# Patient Record
Sex: Female | Born: 2000 | Race: Asian | Hispanic: No | Marital: Single | Smoking: Never smoker
Health system: Southern US, Community
[De-identification: ages and names within clinical notes are randomized; demographics above are authoritative.]

---
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
## 2019-05-28 ENCOUNTER — Other Ambulatory Visit: Payer: Self-pay

## 2019-05-28 ENCOUNTER — Emergency Department (HOSPITAL_COMMUNITY)
Admission: EM | Admit: 2019-05-28 | Discharge: 2019-05-28 | Disposition: A | Discharge status: Home / Self Care | Payer: BLUE CROSS/BLUE SHIELD | Attending: Emergency Medicine | Admitting: Emergency Medicine

## 2019-05-28 ENCOUNTER — Emergency Department (HOSPITAL_COMMUNITY): Payer: BLUE CROSS/BLUE SHIELD

## 2019-05-28 DIAGNOSIS — Y999 Unspecified external cause status: Secondary | ICD-10-CM | POA: Diagnosis not present

## 2019-05-28 DIAGNOSIS — S99911A Unspecified injury of right ankle, initial encounter: Secondary | ICD-10-CM | POA: Diagnosis present

## 2019-05-28 DIAGNOSIS — X501XXA Overexertion from prolonged static or awkward postures, initial encounter: Secondary | ICD-10-CM | POA: Diagnosis not present

## 2019-05-28 DIAGNOSIS — S93401A Sprain of unspecified ligament of right ankle, initial encounter: Secondary | ICD-10-CM | POA: Diagnosis not present

## 2019-05-28 DIAGNOSIS — Y939 Activity, unspecified: Secondary | ICD-10-CM | POA: Insufficient documentation

## 2019-05-28 DIAGNOSIS — Y929 Unspecified place or not applicable: Secondary | ICD-10-CM | POA: Insufficient documentation

## 2019-05-28 MED ORDER — ACETAMINOPHEN 325 MG PO TABS
650.0000 mg | ORAL_TABLET | Freq: Once | ORAL | Status: AC
  Administered 2019-05-28: 650 mg via ORAL
  Filled 2019-05-28: qty 2

## 2019-05-28 MED ORDER — IBUPROFEN 200 MG PO TABS
600.0000 mg | ORAL_TABLET | Freq: Once | ORAL | Status: AC
  Administered 2019-05-28: 600 mg via ORAL
  Filled 2019-05-28: qty 3

## 2019-05-28 NOTE — ED Provider Notes (Signed)
[facility] DEPT Provider Note   CSN: [id number] Arrival date & time: 05/28/19  0944     History Chief Complaint  Patient presents with  . Ankle Injury    right    Mary Blanchard is a 19 y.o. female who presents to ED with a chief complaint of right ankle pain and swelling.  1 week ago states that she twisted her ankle.  States that she has had several sprains on this ankle in the past but usually improve after several days.  She remains ambulatory.  She noticed this is swelling and pain did not improve despite using ice.  She denies any prior fracture, dislocations or procedures in the area.  Denies any warmth or redness of joint, changes in sensation or other injuries.  She denies possibility of pregnancy.  HPI     No past medical history on file.  There are no problems to display for this patient.   No past surgical history on file.   OB History   No obstetric history on file.     No family history on file.  Social History   Tobacco Use  . Smoking status: Never Smoker  . Smokeless tobacco: Never Used  Substance Use Topics  . Alcohol use: Never  . Drug use: Never    Home Medications Prior to Admission medications   Not on File    Allergies    Patient has no known allergies.  Review of Systems   Review of Systems  Constitutional: Negative for chills and fever.  Musculoskeletal: Positive for arthralgias. Negative for myalgias.  Skin: Negative for wound.  Neurological: Negative for weakness and numbness.    Physical Exam Updated Vital Signs BP 126/70 (BP Location: Left Arm)   Pulse 66   Temp 98.9 F (37.2 C) (Oral)   Resp 16   Ht 5' (1.524 m)   Wt 45.4 kg   LMP 05/18/2019   SpO2 100%   BMI 19.53 kg/m   Physical Exam Vitals and nursing note reviewed.  Constitutional:      General: She is not in acute distress.    Appearance: She is well-developed. She is not diaphoretic.  HENT:     Head: Normocephalic and  atraumatic.  Eyes:     General: No scleral icterus.    Conjunctiva/sclera: Conjunctivae normal.  Pulmonary:     Effort: Pulmonary effort is normal. No respiratory distress.  Musculoskeletal:        General: Swelling and tenderness present.     Cervical back: Normal range of motion.     Comments: Tenderness to palpation and slight swelling of the right lateral ankle.  Able to perform range of motion although reports pain with inversion.  2+ DP pulses palpated bilaterally.  Normal sensation to light touch.  Compartments are soft.  No wounds noted.  No warmth or erythema noted.  Skin:    Findings: No rash.  Neurological:     Mental Status: She is alert.     ED Results / Procedures / Treatments   Labs (all labs ordered are listed, but only abnormal results are displayed) Labs Reviewed - No data to display  EKG None  Radiology DG Ankle Complete Right  Result Date: 05/28/2019 CLINICAL DATA:  Soccer injury 1 week ago with persistent pain, initial encounter EXAM: RIGHT ANKLE - COMPLETE 3+ VIEW COMPARISON:  08/20/2017 FINDINGS: There is no evidence of fracture, dislocation, or joint effusion. There is no evidence of arthropathy or other focal  bone abnormality. Soft tissues are unremarkable. IMPRESSION: No acute abnormality noted. Electronically Signed   By: Alcide Clever M.D.   On: 05/28/2019 10:46    Procedures Procedures (including critical care time)  Medications Ordered in ED Medications  ibuprofen (ADVIL) tablet 600 mg (has no administration in time range)  acetaminophen (TYLENOL) tablet 650 mg (has no administration in time range)    ED Course  I have reviewed the triage vital signs and the nursing notes.  Pertinent labs & imaging results that were available during my care of the patient were reviewed by me and considered in my medical decision making (see chart for details).    MDM Rules/Calculators/A&P                      19 year old female presenting to the ED with  right ankle injury 1 week ago.  She has had several sprains in the same ankle in the past but the specific one has been taking longer to improve despite using ice.  She denies any prior fracture, dislocations or procedures in the area.  There is tenderness and swelling of the right lateral ankle without changes to range of motion.  Areas are vastly intact and compartments are soft.  Good distal pulses.  X-rays negative for acute abnormality.  Suspect that symptoms are again due to a sprain.  Feel that she will benefit from compression, rice therapy and orthopedic follow-up.  Patient advised to use NSAIDs as needed.  All imaging, if done today, including plain films, CT scans, and ultrasounds, independently reviewed by me, and interpretations confirmed via formal radiology reads.  Patient is hemodynamically stable, in NAD, and able to ambulate in the ED. Evaluation does not show pathology that would require ongoing emergent intervention or inpatient treatment. I explained the diagnosis to the patient. Pain has been managed and has no complaints prior to discharge. Patient is comfortable with above plan and is stable for discharge at this time. All questions were answered prior to disposition. Strict return precautions for returning to the ED were discussed. Encouraged follow up with PCP.   An After Visit Summary was printed and given to the patient.   Portions of this note were generated with Scientist, clinical (histocompatibility and immunogenetics). Dictation errors may occur despite best attempts at proofreading.  Final Clinical Impression(s) / ED Diagnoses Final diagnoses:  Sprain of right ankle, unspecified ligament, initial encounter    Rx / DC Orders ED Discharge Orders    None       Dietrich Pates, PA-C 05/28/19 1105    Terrilee Files, MD 05/28/19 [phone]

## 2019-05-28 NOTE — Progress Notes (Signed)
Orthopedic Tech Progress Note Patient Details:  [facility]ou [facility] 10-29-2000 [id number]  Ortho Devices Type of Ortho Device: ASO Ortho Device/Splint Location: RLE Ortho Device/Splint Interventions: Ordered, Application, Adjustment   Post Interventions Patient Tolerated: Well Instructions Provided: Poper ambulation with device, Care of device, Adjustment of device   Cree N Simpson 05/28/2019, 11:32 AM

## 2019-05-28 NOTE — Discharge Instructions (Addendum)
Follow instructions regarding your ankle sprain. You can take Tylenol and ibuprofen to help with pain and swelling. Wear the brace for compression. Follow-up with orthopedist listed below. Return to the ER if you start to experience additional injuries, worsening pain, swelling, redness or warmth of your joint.

## 2019-05-28 NOTE — ED Triage Notes (Signed)
Patient states she was playing soccer a week ago and injured her right ankle. Patient states she heard a cracking sound. Bruising noted to right ankle. Pain rated 3/10

## 2021-08-28 IMAGING — CR DG ANKLE COMPLETE 3+V*R*
3 series · 3 of 3 positions shown · non-contrast
Comparison: 08/20/2017

CLINICAL DATA: Soccer injury 1 week ago with persistent pain,
initial encounter

EXAM:
RIGHT ANKLE - COMPLETE 3+ VIEW

[x ankle ap right]
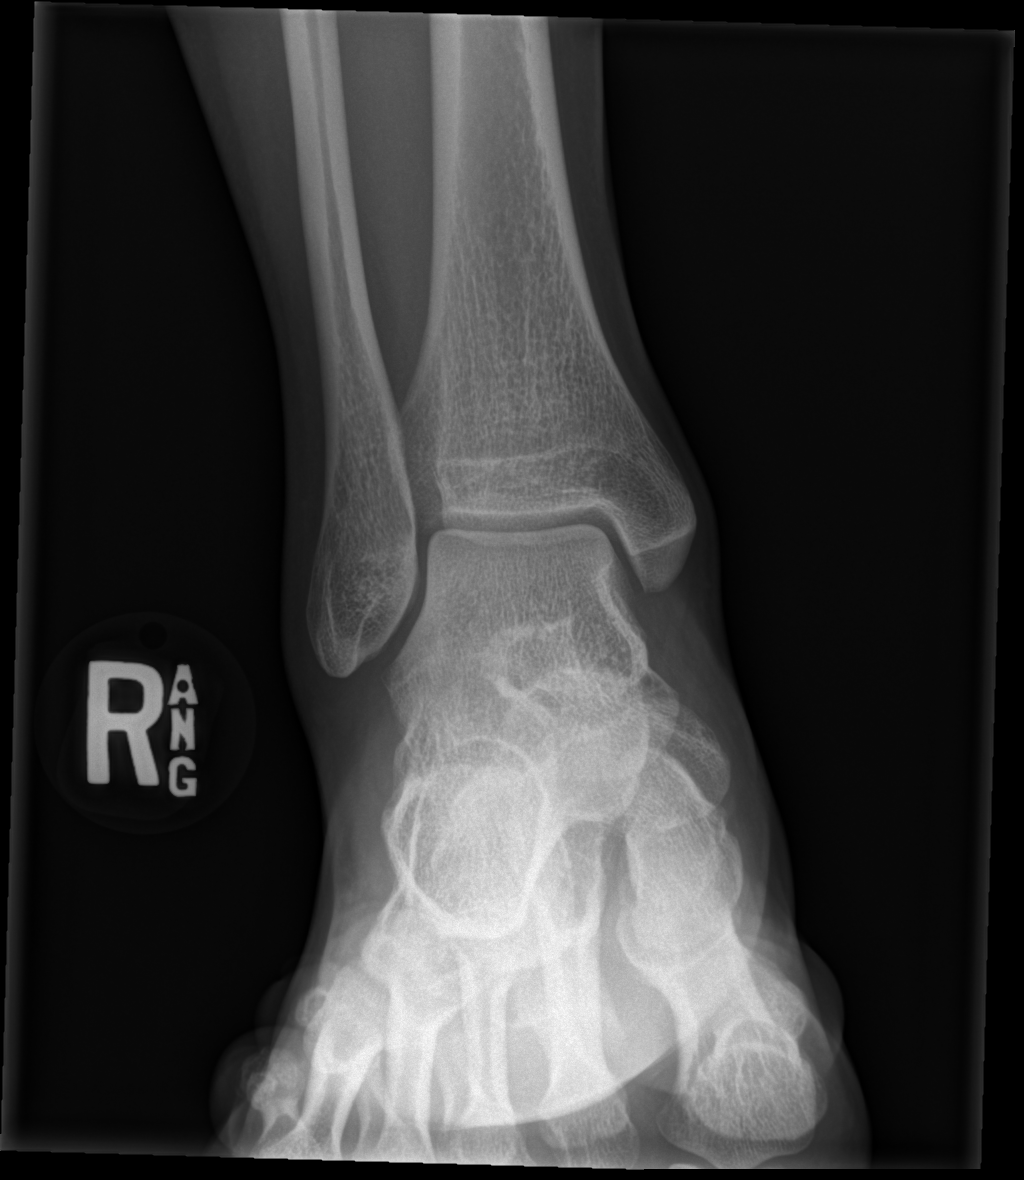

[x ankle obl right]
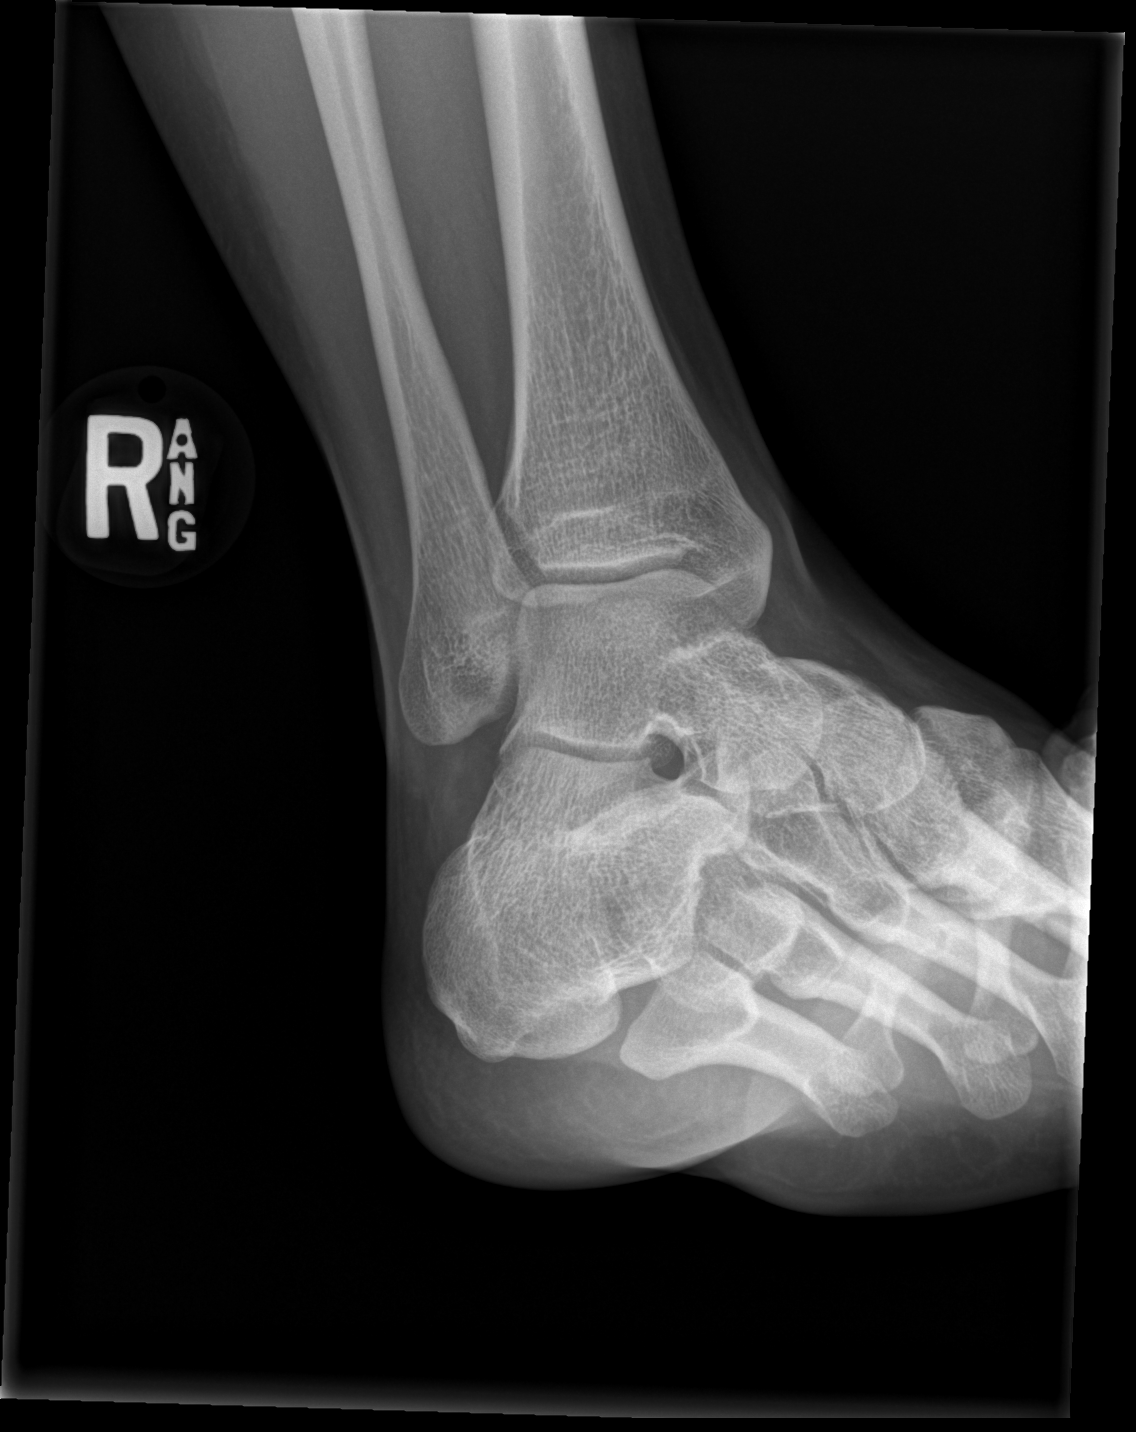

[x ankle lat right]
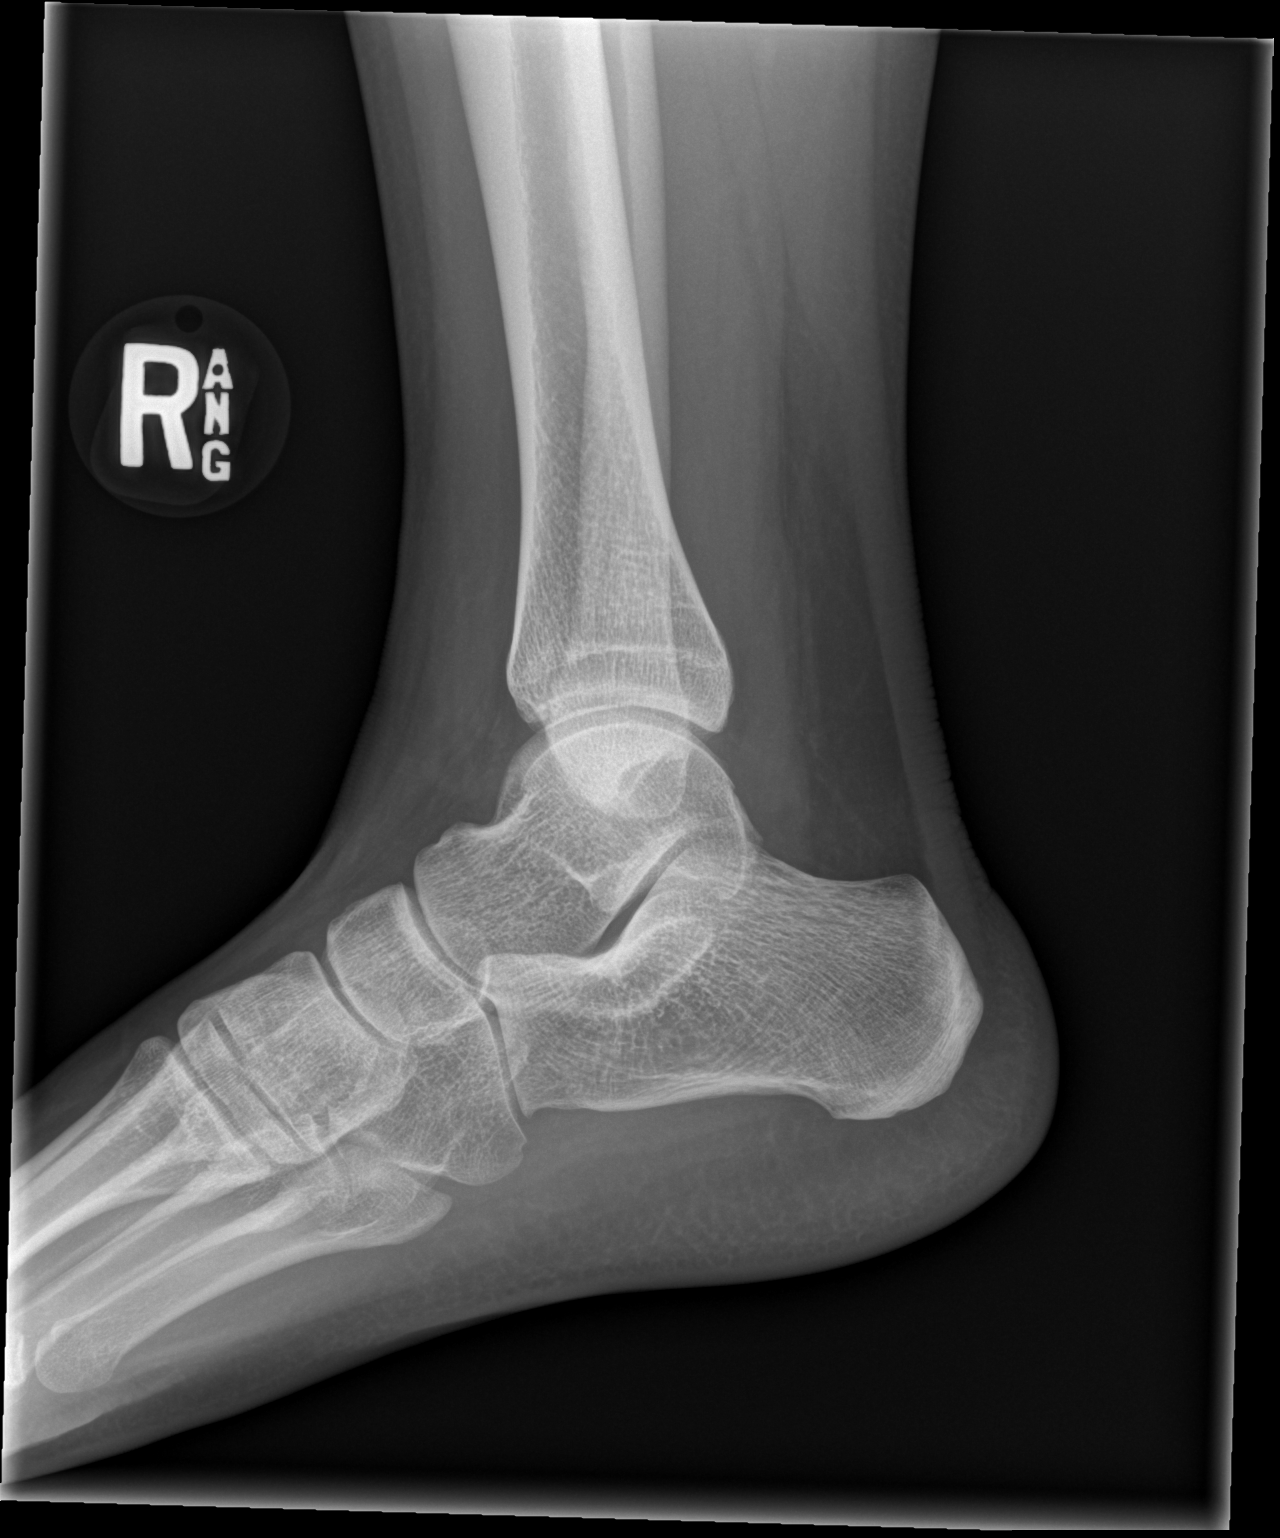

[3 of 3 positions shown; findings below may reference images not displayed]

FINDINGS: There is no evidence of fracture, dislocation, or joint effusion.
There is no evidence of arthropathy or other focal bone abnormality.
Soft tissues are unremarkable.
IMPRESSION: No acute abnormality noted.
